# Patient Record
Sex: Male | Born: 1980 | Race: White | Hispanic: No | Marital: Married | Smoking: Former smoker
Health system: Southern US, Community
[De-identification: ages and names within clinical notes are randomized; demographics above are authoritative.]

## PROBLEM LIST (undated history)

## (undated) DIAGNOSIS — M069 Rheumatoid arthritis, unspecified: Secondary | ICD-10-CM

## (undated) HISTORY — PX: VASECTOMY: SHX75

---

## 2016-06-05 ENCOUNTER — Emergency Department: Payer: Managed Care, Other (non HMO)

## 2016-06-05 ENCOUNTER — Emergency Department
Admission: EM | Admit: 2016-06-05 | Discharge: 2016-06-05 | Disposition: A | Discharge status: Home / Self Care | Payer: Managed Care, Other (non HMO) | Attending: Emergency Medicine | Admitting: Emergency Medicine

## 2016-06-05 DIAGNOSIS — Z87891 Personal history of nicotine dependence: Secondary | ICD-10-CM | POA: Diagnosis not present

## 2016-06-05 DIAGNOSIS — R072 Precordial pain: Secondary | ICD-10-CM | POA: Diagnosis present

## 2016-06-05 DIAGNOSIS — R0789 Other chest pain: Secondary | ICD-10-CM

## 2016-06-05 HISTORY — DX: Rheumatoid arthritis, unspecified: M06.9

## 2016-06-05 LAB — BASIC METABOLIC PANEL
Anion gap: 9 (ref 5–15)
BUN: 12 mg/dL (ref 6–20)
CALCIUM: 9.9 mg/dL (ref 8.9–10.3)
CO2: 27 mmol/L (ref 22–32)
CREATININE: 0.9 mg/dL (ref 0.61–1.24)
Chloride: 106 mmol/L (ref 101–111)
GFR calc Af Amer: >60 mL/min (ref >60)
GLUCOSE: 111 mg/dL — AB (ref 65–99)
Potassium: 4.1 mmol/L (ref 3.5–5.1)
Sodium: 142 mmol/L (ref 135–145)

## 2016-06-05 LAB — CBC
HEMATOCRIT: 45.4 % (ref 40.0–52.0)
Hemoglobin: 15 g/dL (ref 13.0–18.0)
MCH: 29.4 pg (ref 26.0–34.0)
MCHC: 33.1 g/dL (ref 32.0–36.0)
MCV: 88.8 fL (ref 80.0–100.0)
Platelets: 245 10*3/uL (ref 150–440)
RBC: 5.11 MIL/uL (ref 4.40–5.90)
RDW: 13 % (ref 11.5–14.5)
WBC: 7.5 10*3/uL (ref 3.8–10.6)

## 2016-06-05 LAB — TROPONIN I

## 2016-06-05 NOTE — Discharge Instructions (Signed)

## 2016-06-05 NOTE — ED Triage Notes (Signed)
Pt c/o substernal chest pain that radiates into the left arm and jaw constant for the past 3 weeks.

## 2016-06-05 NOTE — ED Provider Notes (Signed)
[facility] Emergency Department Provider Note  ____________________________________________   First MD Initiated Contact with Patient 06/05/16 1059     (approximate)  I have reviewed the triage vital signs and the nursing notes.   HISTORY  Chief Complaint Chest Pain    HPI Russell Elliott is a 35 y.o. 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  He states that at times it radiates to his left arm into his jaw but that that is rare.  Nothing particular seems to make it worse and it is freely better when he takes deep breaths.  He does not have high blood pressure, diabetes, tobacco use, and has no first-degree relatives that have ever had heart attacks.  He states that he has been under a great deal of stress over the last month because of possible job, the ending of his relationship, and a daughter who ran away.  He has been sitting around thinking a lot about the pain which he wonders if it has made him more worriedand he would have than otherwise.  He already takes meloxicam and gabapentin for his rheumatoid arthritis and the pain associated with it.  He describes the chest pain as mild to moderate in intensity and is an aching pain that is sometimes sharp and stabbing.  He has found a new job and will be leaving in 3 days so he thought he should get checked out before he goes to make sure everything is okay.   Past Medical History:  Diagnosis Date  . RA (rheumatoid arthritis) (HCC)     There are no active problems to display for this patient.   Past Surgical History:  Procedure Laterality Date  . VASECTOMY      Prior to Admission medications   Not on File    Allergies Bee pollen; Penicillins; and Shellfish allergy  No family history on file.  Social History Social History  Substance Use Topics  . Smoking status: Former  Games developermoker  . Smokeless tobacco: Never Used  . Alcohol use Yes    Review of Systems Constitutional: No fever/chills Eyes: No visual changes. ENT: No sore throat. Cardiovascular: +chest pain. Respiratory: Denies shortness of breath. Gastrointestinal: No abdominal pain.  No nausea, no vomiting.  No diarrhea.  No constipation. Genitourinary: Negative for dysuria. Musculoskeletal: Negative for back pain. Skin: Negative for rash. Neurological: Negative for headaches, focal weakness or numbness.  10-point ROS otherwise negative.  ____________________________________________   PHYSICAL EXAM:  VITAL SIGNS: ED Triage Vitals  Enc Vitals Group     BP 06/05/16 0927 134/74     Pulse Rate 06/05/16 0927 81     Resp 06/05/16 0927 16     Temp 06/05/16 0927 98.3 F (36.8 C)     Temp Source 06/05/16 0927 Oral     SpO2 --      Weight 06/05/16 0927 145 lb (65.8 kg)     Height 06/05/16 0927 5\' 6"  (1.676 m)     Head Circumference --      Peak Flow --      Pain Score 06/05/16 0934 5     Pain Loc --      Pain Edu? --      Excl. in GC? --     Constitutional: Alert and oriented. Well appearing and in no acute distress. Eyes: Conjunctivae are normal. PERRL. EOMI. Head: Atraumatic. Nose: No congestion/rhinnorhea.  Mouth/Throat: Mucous membranes are moist.  Oropharynx non-erythematous. Neck: No stridor.  No meningeal signs.   Cardiovascular: Normal rate, regular rhythm. Good peripheral circulation. Grossly normal heart sounds. Respiratory: Normal respiratory effort.  No retractions. Lungs CTAB. Gastrointestinal: Soft and nontender. No distention.  Musculoskeletal: No lower extremity tenderness nor edema. No gross deformities of extremities. Neurologic:  Normal speech and language. No gross focal neurologic deficits are appreciated.  Skin:  Skin is warm, dry and intact. No rash noted. Psychiatric: Mood and affect are normal. Speech and behavior are  normal.  ____________________________________________   LABS (all labs ordered are listed, but only abnormal results are displayed)  Labs Reviewed  BASIC METABOLIC PANEL - Abnormal; Notable for the following:       Result Value   Glucose, Bld 111 (*)    All other components within normal limits  CBC  TROPONIN I   ____________________________________________  EKG  ED ECG REPORT I, Monroe Qin, the attending physician, personally viewed and interpreted this ECG.  Date: 06/05/2016 EKG Time: 09:23 Rate: 74 Rhythm: normal sinus rhythm QRS Axis: normal Intervals: Short PR interval of 106 ms ST/T Wave abnormalities: normal Conduction Disturbances: none Narrative Interpretation: unremarkable   ____________________________________________  RADIOLOGY   Dg Chest 2 View  Result Date: 06/05/2016 CLINICAL DATA:  35 year old male with history of chest pain on the left side for 1 week. EXAM: CHEST  2 VIEW COMPARISON:  No priors. FINDINGS: Lung volumes are normal. No consolidative airspace disease. No pleural effusions. No pneumothorax. No pulmonary nodule or mass noted. Pulmonary vasculature and the cardiomediastinal silhouette are within normal limits. IMPRESSION: No radiographic evidence of acute cardiopulmonary disease. Electronically Signed   By: Trudie Reed M.D.   On: 06/05/2016 10:11    ____________________________________________   PROCEDURES  Procedure(s) performed:   Procedures   Critical Care performed: No ____________________________________________   INITIAL IMPRESSION / ASSESSMENT AND PLAN / ED COURSE  Pertinent labs & imaging results that were available during my care of the patient were reviewed by me and considered in my medical decision making (see chart for details).  The patient is well-appearing and in no acute distress with normal vital signs, normal labs, unremarkable EKG, and normal chest x-ray.  He has a HEART score of zero and is PERC  negative.  I provided reassurance to the patient and encouraged him to take a daily low-dose aspirin but since he has artery on meloxicam I would not change his NSAID therapy too much.  I will give him the name and number for cardiologist with whom he can follow-up but suggested his primary care doctor may be the best bed.  I suspect his chest pain is related to stress and likely is musculoskeletal.  I gave my usual and customary return precautions.  He is comfortable with the plan for outpatient follow-up    Clinical Course  Value Comment By Time  EKG 12-Lead (Reviewed) Loleta Rose, MD 10/11 1059    ____________________________________________  FINAL CLINICAL IMPRESSION(S) / ED DIAGNOSES  Final diagnoses:  Atypical chest pain     MEDICATIONS GIVEN DURING THIS VISIT:  Medications - No data to display   NEW OUTPATIENT MEDICATIONS STARTED DURING THIS VISIT:  There are no discharge medications for this patient.   There are no discharge medications for this patient.   There are no discharge medications for this patient.    Note:  This document was prepared using Dragon voice recognition software and may include unintentional dictation errors.    Kandee Keen  York Cerise, MD 06/05/16 1610

## 2017-09-05 IMAGING — CR DG CHEST 2V
1 series · 2 of 2 positions shown · non-contrast
Comparison: No priors.

CLINICAL DATA: 35-year-old male with history of chest pain on the
left side for 1 week.

EXAM:
CHEST  2 VIEW

[Series 1: w chest pa · 0.14mm/px · 2 of 2 slices shown]
[im 1/2]
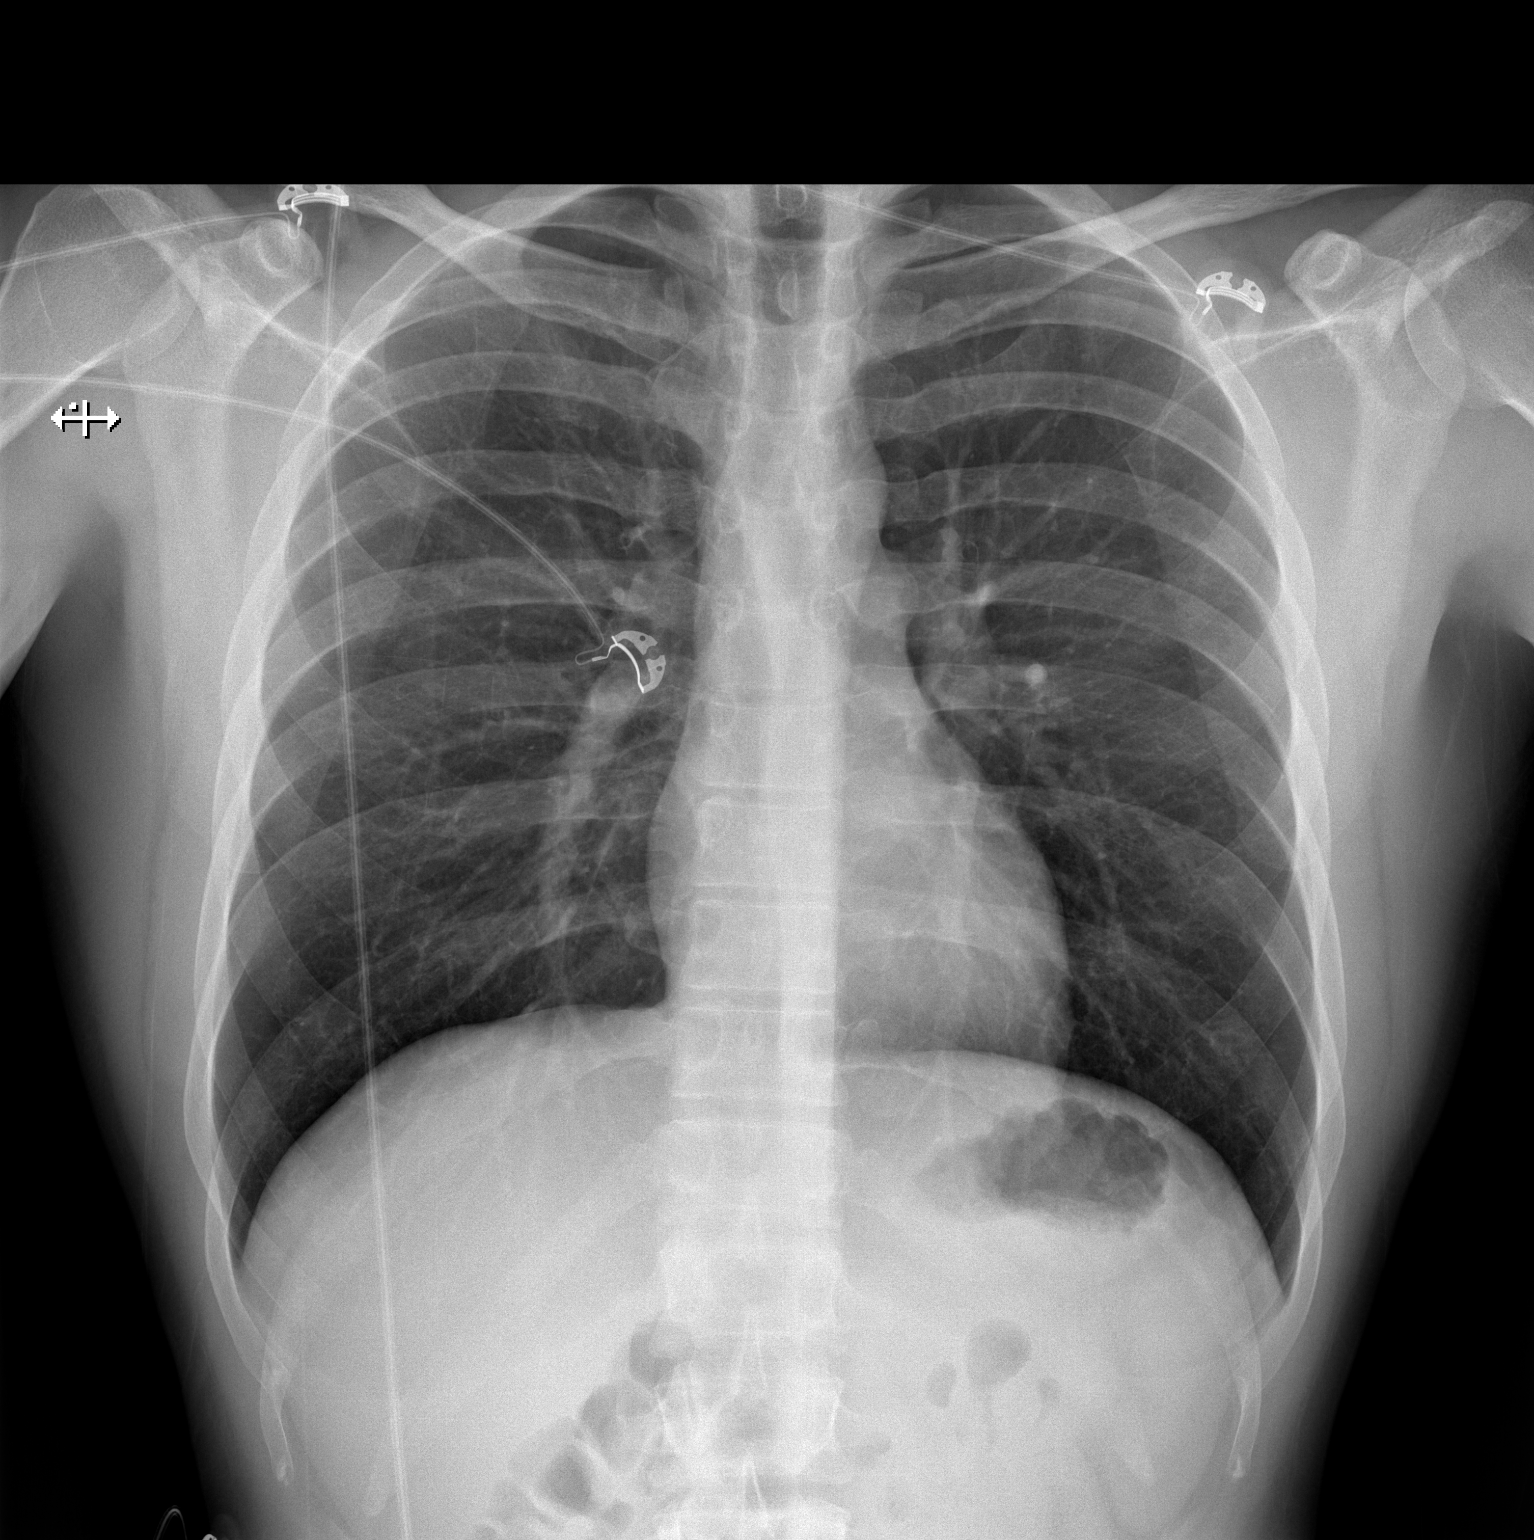
[im 2/2]
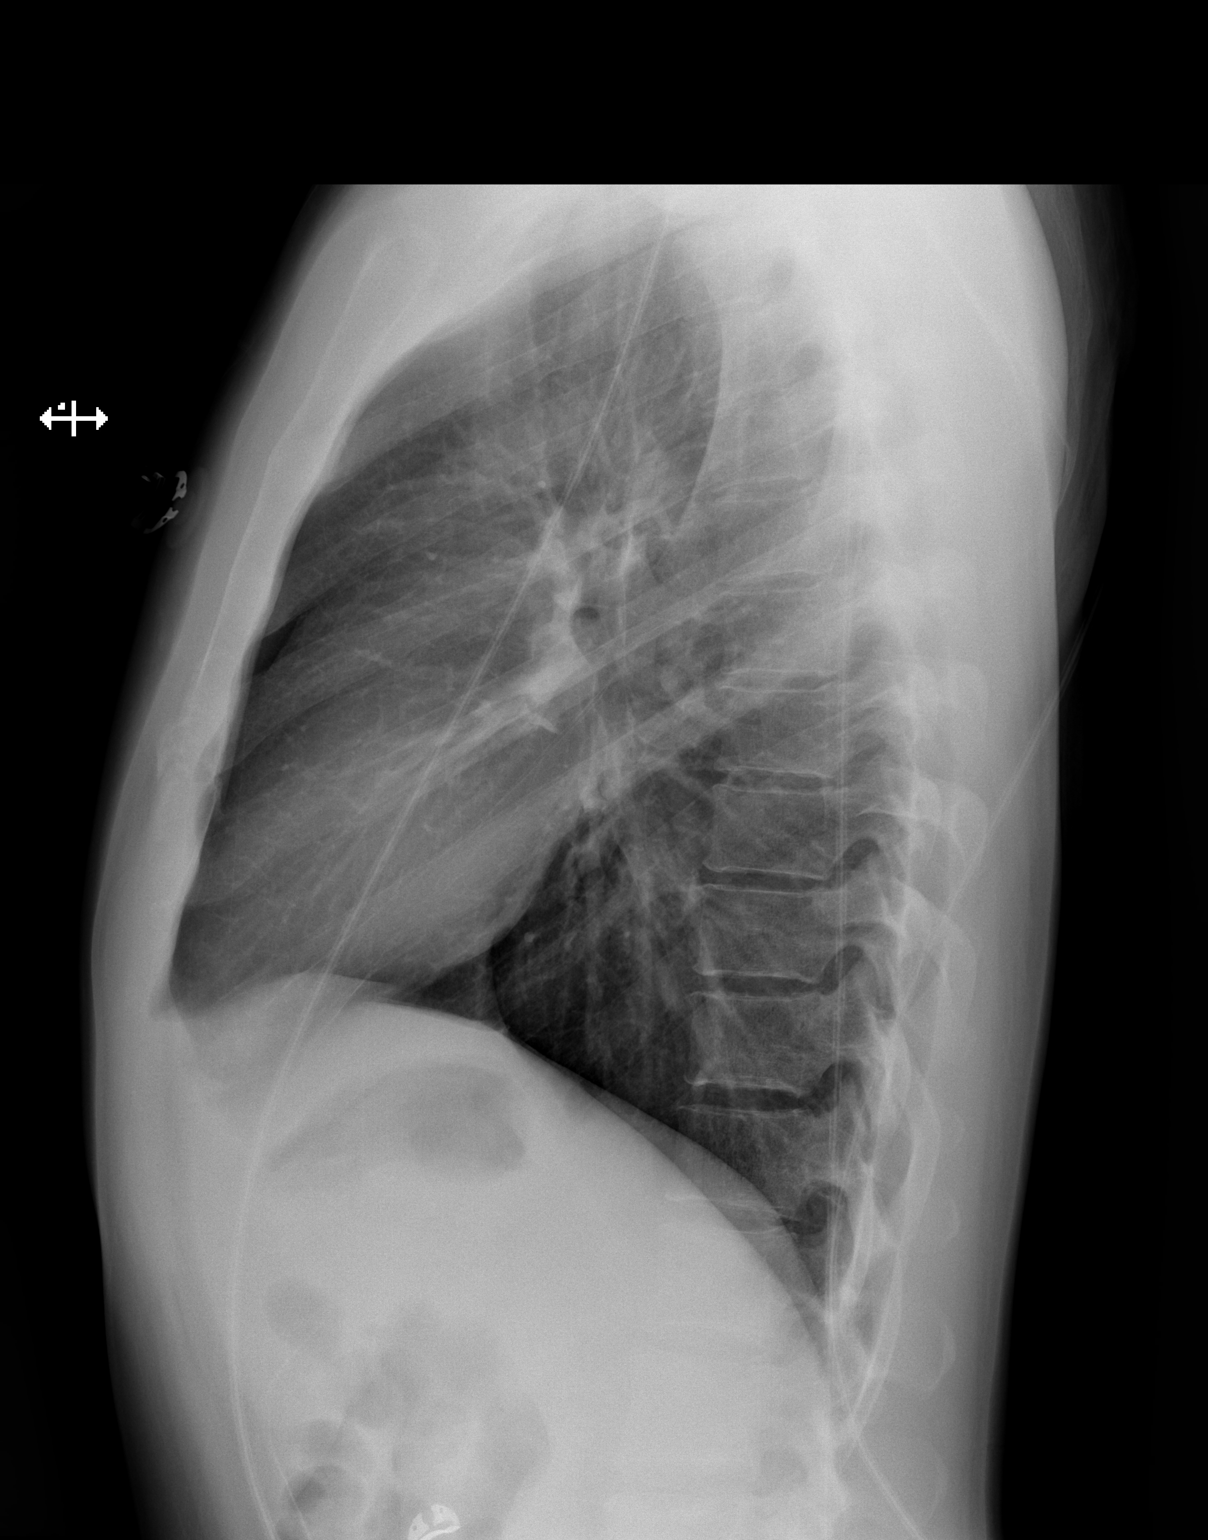

[2 of 2 positions shown; findings below may reference images not displayed]

FINDINGS: Lung volumes are normal. No consolidative airspace disease. No
pleural effusions. No pneumothorax. No pulmonary nodule or mass
noted. Pulmonary vasculature and the cardiomediastinal silhouette
are within normal limits.
IMPRESSION: No radiographic evidence of acute cardiopulmonary disease.

## 2019-03-10 ENCOUNTER — Other Ambulatory Visit: Payer: Self-pay | Admitting: Obstetrics and Gynecology

## 2019-03-10 ENCOUNTER — Other Ambulatory Visit: Payer: Self-pay

## 2019-03-10 DIAGNOSIS — N469 Male infertility, unspecified: Secondary | ICD-10-CM

## 2019-03-18 LAB — SEMEN ANALYSIS, BASIC
Appearance: NORMAL
Concentration, Sperm: NONE SEEN x10E6/mL (ref >14.9)
Immotile Sperm: 100 %
Time Collected: 940
Time Received: 1000
Time Since Last Emission: 6 days
Volume: 4.9 mL (ref >1.4)
pH: 8 (ref >7.1)

## 2019-03-19 NOTE — Progress Notes (Signed)
Called and discussed with patient. He informed me that he had a vasectomy and a vasectomy reversal. He was not surprised by this result. He said he has a lot of scar tissue. He understands repeating the semen analysis would be reasonable but at this time it is not necessary until he and Janett Billow are ready to pursue conceiving. Discussed that he would likely need a semen retrieval procedure to have a biological pregnancy with Janett Billow and would need referral to REI/urological reproduction specialist.

## 2021-01-22 ENCOUNTER — Emergency Department
Admission: EM | Admit: 2021-01-22 | Discharge: 2021-01-22 | Disposition: A | Discharge status: Home / Self Care | Payer: 59 | Attending: Emergency Medicine | Admitting: Emergency Medicine

## 2021-01-22 ENCOUNTER — Other Ambulatory Visit: Payer: Self-pay

## 2021-01-22 DIAGNOSIS — Z87891 Personal history of nicotine dependence: Secondary | ICD-10-CM | POA: Diagnosis not present

## 2021-01-22 DIAGNOSIS — R509 Fever, unspecified: Secondary | ICD-10-CM | POA: Diagnosis not present

## 2021-01-22 DIAGNOSIS — J029 Acute pharyngitis, unspecified: Secondary | ICD-10-CM | POA: Insufficient documentation

## 2021-01-22 MED ORDER — VALACYCLOVIR HCL 1 G PO TABS: 1000.0000 mg | ORAL_TABLET | Freq: Three times a day (TID) | ORAL | 0 refills | Status: AC

## 2021-01-22 MED ORDER — PREDNISONE 10 MG (21) PO TBPK: ORAL_TABLET | ORAL | 0 refills | Status: AC

## 2021-01-22 MED ORDER — LIDOCAINE VISCOUS HCL 2 % MT SOLN: 15.0000 mL | OROMUCOSAL | 0 refills | Status: AC | PRN

## 2021-01-22 MED ORDER — DEXAMETHASONE SODIUM PHOSPHATE 10 MG/ML IJ SOLN
10.0000 mg | Freq: Once | INTRAMUSCULAR | Status: AC
  Administered 2021-01-22: 10 mg via INTRAMUSCULAR
  Filled 2021-01-22: qty 1

## 2021-01-22 MED ORDER — LIDOCAINE VISCOUS HCL 2 % MT SOLN
15.0000 mL | Freq: Once | OROMUCOSAL | Status: AC
  Administered 2021-01-22: 15 mL via OROMUCOSAL
  Filled 2021-01-22: qty 15

## 2021-01-22 NOTE — ED Triage Notes (Signed)
Sore throat x2 weeks. Has had flu, mono, covid and strep test - all negative. Has felt intermittently febrile, no fever in ED. States getting worse despite taking allergy meds. Reports it is becoming difficult to swallow d/t pain. Cough x2 days.

## 2021-01-22 NOTE — ED Triage Notes (Signed)
Pt comes with c/o sore throat for about 2 weeks.

## 2021-01-22 NOTE — Discharge Instructions (Addendum)
Please seek medical attention for any high fevers, chest pain, shortness of breath, change in behavior, persistent vomiting, bloody stool or any other new or concerning symptoms.  

## 2021-01-22 NOTE — ED Provider Notes (Signed)
[facility] Emergency Department Provider Note   ____________________________________________   I have reviewed the triage vital signs and the nursing notes.   HISTORY  Chief Complaint Sore Throat   History limited by: Not Limited   HPI Russell Elliott is a 40 y.o. male who presents to the emergency department today because of concern for continued and worsening sore throat. The patient states that he had his first bout of sore throat a couple of months ago shortly after a dental procedure. The patient states that he was put on steroids at that time, it then got better however he then had another dental procedure and the sore throat came back. Saw urgent care last week where he states he tested negative for strep, covid, mono. The patient states he feels like he has had fevers although he has not measured any.   Records reviewed. Per medical record review patient has a history of RA  Past Medical History:  Diagnosis Date  . RA (rheumatoid arthritis) (HCC)     There are no problems to display for this patient.   Past Surgical History:  Procedure Laterality Date  . VASECTOMY      Prior to Admission medications   Not on File    Allergies Bee pollen, Penicillins, and Shellfish allergy  No family history on file.  Social History Social History   Tobacco Use  . Smoking status: Former Games developer  . Smokeless tobacco: Never Used  Substance Use Topics  . Alcohol use: Yes  . Drug use: No    Review of Systems Constitutional: No fever/chills Eyes: No visual changes. ENT: Positive for sore throat.  Cardiovascular: Denies chest pain. Respiratory: Denies shortness of breath. Gastrointestinal: No abdominal pain.  No nausea, no vomiting.  No diarrhea.   Genitourinary: Negative for dysuria. Musculoskeletal: Negative for back pain. Skin: Negative for rash. Neurological: Negative for headaches, focal weakness or  numbness.  ____________________________________________   PHYSICAL EXAM:  VITAL SIGNS: ED Triage Vitals  Enc Vitals Group     BP 01/22/21 1203 (!) 142/90     Pulse Rate 01/22/21 1203 99     Resp 01/22/21 1203 17     Temp 01/22/21 1203 98.7 F (37.1 C)     Temp Source 01/22/21 1203 Oral     SpO2 01/22/21 1203 95 %     Weight 01/22/21 1204 177 lb (80.3 kg)     Height 01/22/21 1204 5\' 6"  (1.676 m)     Head Circumference --      Peak Flow --      Pain Score 01/22/21 1155 6   Constitutional: Alert and oriented.  Eyes: Conjunctivae are normal.  ENT      Head: Normocephalic and atraumatic.      Nose: No congestion/rhinnorhea.      Mouth/Throat: Multiple small white lesions to soft palate.       Neck: No stridor. Hematological/Lymphatic/Immunilogical: No cervical lymphadenopathy. Cardiovascular: Normal rate, regular rhythm.  No murmurs, rubs, or gallops.  Respiratory: Normal respiratory effort without tachypnea nor retractions. Breath sounds are clear and equal bilaterally. No wheezes/rales/rhonchi. Gastrointestinal: Soft and non tender. No rebound. No guarding.  Genitourinary: Deferred Musculoskeletal: Normal range of motion in all extremities. No lower extremity edema. Neurologic:  Normal speech and language. No gross focal neurologic deficits are appreciated.  Skin:  Skin is warm, dry and intact. No rash noted. Psychiatric: Mood and affect are normal. Speech and behavior are normal. Patient exhibits appropriate insight and judgment.  ____________________________________________  LABS (pertinent positives/negatives)  None  ____________________________________________   EKG  None  ____________________________________________    RADIOLOGY  None  ____________________________________________   PROCEDURES  Procedures  ____________________________________________   INITIAL IMPRESSION / ASSESSMENT AND PLAN / ED COURSE  Pertinent labs & imaging results  that were available during my care of the patient were reviewed by me and considered in my medical decision making (see chart for details).   Patient presents to the emergency department today with complaints of continued sore throat. Patient does have multiple small white lesions to pharynx. At this time will plan on treating with antiviral and steroids. The patient states he has follow up already scheduled in 2 days with primary care physician. Will give patient ENT follow up information.   ____________________________________________   FINAL CLINICAL IMPRESSION(S) / ED DIAGNOSES  Final diagnoses:  Pharyngitis, unspecified etiology     Note: This dictation was prepared with Dragon dictation. Any transcriptional errors that result from this process are unintentional     Phineas Semen, MD 01/22/21 1446
